# Patient Record
Sex: Female | Born: 1980 | Race: Black or African American | Hispanic: No | Marital: Married
Health system: Southern US, Community
[De-identification: ages and names within clinical notes are randomized; demographics above are authoritative.]

## PROBLEM LIST (undated history)

## (undated) DIAGNOSIS — F419 Anxiety disorder, unspecified: Secondary | ICD-10-CM

## (undated) DIAGNOSIS — F988 Other specified behavioral and emotional disorders with onset usually occurring in childhood and adolescence: Secondary | ICD-10-CM

## (undated) HISTORY — PX: TONSILLECTOMY: SUR1361

---

## 2011-11-03 ENCOUNTER — Emergency Department: Payer: Self-pay

## 2011-11-03 ENCOUNTER — Emergency Department
Admission: EM | Admit: 2011-11-03 | Discharge: 2011-11-03 | Disposition: A | Discharge status: Home / Self Care | Payer: Enrolled Prime—HMO | Attending: Emergency Medicine | Admitting: Emergency Medicine

## 2011-11-03 DIAGNOSIS — S139XXA Sprain of joints and ligaments of unspecified parts of neck, initial encounter: Secondary | ICD-10-CM | POA: Insufficient documentation

## 2011-11-03 DIAGNOSIS — IMO0002 Reserved for concepts with insufficient information to code with codable children: Secondary | ICD-10-CM

## 2011-11-03 HISTORY — DX: Other specified behavioral and emotional disorders with onset usually occurring in childhood and adolescence: F98.8

## 2011-11-03 MED ORDER — CYCLOBENZAPRINE HCL 10 MG PO TABS
10.00 mg | ORAL_TABLET | Freq: Once | ORAL | Status: AC
Start: 2011-11-03 — End: 2011-11-03
  Administered 2011-11-03: 10 mg via ORAL
  Filled 2011-11-03: qty 1

## 2011-11-03 MED ORDER — IBUPROFEN 400 MG PO TABS
800.00 mg | ORAL_TABLET | Freq: Once | ORAL | Status: AC
Start: 2011-11-03 — End: 2011-11-03
  Administered 2011-11-03: 800 mg via ORAL
  Filled 2011-11-03: qty 2

## 2011-11-03 MED ORDER — CYCLOBENZAPRINE HCL 10 MG PO TABS
10.00 mg | ORAL_TABLET | Freq: Once | ORAL | Status: AC
Start: 2011-11-03 — End: 2011-11-13

## 2011-11-03 MED ORDER — IBUPROFEN 800 MG PO TABS
800.00 mg | ORAL_TABLET | Freq: Once | ORAL | Status: AC
Start: 2011-11-03 — End: 2011-11-13

## 2011-11-03 NOTE — Discharge Instructions (Signed)
----------------------------------- Begin Special Instructions ----------------------------------  It has been a pleasure serving you today.     Please take the medicine as needed/prescribed for pain.     Do not drive or drink alcohol if you take an narcotic medicines (Vicodin/Percocet) or muscle relaxers (Flexeril/Valium). These medicines are sedating.     IF this is the first time you have been prescribed both a narcotic and a muscle relaxer, please take them 2 hours apart from each other to ensure you are not unsafely sedated.     Please follow up with your primary care doctor in 4-5 days.     Follow up with the spine specialist as recommended.     Return to the nearest ER IMMEDIATLEY if you develop any numbness or weakness or your arms or legs, if you soil yourself or and any numbness of your groin. Return IMMEDIATELY for sudden or increasing chest pain or abdominal pain, increasing back pain with a fever, or severe headache with nausea and or persistent vomiting or vision changes.     Thank you      Cervical Strain    You have been diagnosed with a neck strain, also called a cervical strain.    The cervical spine is between the base of the skull and the top of the shoulders.    A strain happens when a muscle is stretched, torn or injured. The pain that you feel is caused by inflammation (swelling) or bruising in the muscle. A strain is not the same as a sprain. A sprain is an injury to a ligament that holds bones together.    A cervical strain occurs when the head snaps forward during an accident or a fall. The muscles can easily be strained with this type of movement. It is normal to experience pain over the muscles around the neck but not over the bones of the cervical spine.    Your doctor did not find any pain over the bones in your neck (even though you might have pain in the neck muscles). This means it is very unlikely that you have a fracture in your neck. Your doctor did not think it was necessary  to take an x-ray.    Apply a warm damp washcloth to the neck for 20 minutes at a time, at least 4 times per day. This will reduce your pain. Massaging your neck might also help.    It is normal to feel stiffness and pain in your neck after a strain. This pain may last for the next few days. If your pain stays about the same or gets better, you probably do not need to see a doctor. However, if your symptoms get worse or you have new symptoms, you should return here or go to the nearest Emergency Department.    Call your physician or go to the nearest Emergency Department if you your pain does not improve within 4 weeks or your pain is bad enough to seriously limit your normal activities.    YOU SHOULD SEEK MEDICAL ATTENTION IMMEDIATELY, EITHER HERE OR AT THE NEAREST EMERGENCY DEPARTMENT, IF ANY OF THE FOLLOWING OCCURS:   Your arms and legs tingle or get numb (lose feeling).   Your arms or legs are weak.   You feel that your neck is unstable.   You lose control of your bladder or bowels. If this were to happen, it may cause you to wet or soil yourself. Some people may actually have problems urinating instead.  Your pain gets worse.      ----------------------------------- Begin Special Instructions ----------------------------------  Cervical Strain    You have been diagnosed with a neck strain, also called a cervical strain.    The cervical spine is between the base of the skull and the top of the shoulders.    A strain happens when a muscle is stretched, torn or injured. The pain that you feel is caused by inflammation (swelling) or bruising in the muscle. A strain is not the same as a sprain. A sprain is an injury to a ligament that holds bones together.    A cervical strain occurs when the head snaps forward during an accident or a fall. The muscles can easily be strained with this type of movement. It is normal to experience pain over the muscles around the neck but not over the bones of the  cervical spine.    Your doctor did not find any pain over the bones in your neck (even though you might have pain in the neck muscles). This means it is very unlikely that you have a fracture in your neck. Your doctor did not think it was necessary to take an x-ray.    Apply a warm damp washcloth to the neck for 20 minutes at a time, at least 4 times per day. This will reduce your pain. Massaging your neck might also help.    It is normal to feel stiffness and pain in your neck after a strain. This pain may last for the next few days. If your pain stays about the same or gets better, you probably do not need to see a doctor. However, if your symptoms get worse or you have new symptoms, you should return here or go to the nearest Emergency Department.    Call your physician or go to the nearest Emergency Department if you your pain does not improve within 4 weeks or your pain is bad enough to seriously limit your normal activities.    YOU SHOULD SEEK MEDICAL ATTENTION IMMEDIATELY, EITHER HERE OR AT THE NEAREST EMERGENCY DEPARTMENT, IF ANY OF THE FOLLOWING OCCURS:   Your arms and legs tingle or get numb (lose feeling).   Your arms or legs are weak.   You feel that your neck is unstable.   You lose control of your bladder or bowels. If this were to happen, it may cause you to wet or soil yourself. Some people may actually have problems urinating instead.   Your pain gets worse.        MEDICATION: ORAL NARCOTIC  You have been prescribed a pain medication that is a narcotic. This drug may cause drowsiness. Therefore, be sure to take it only as directed.  DIRECTIONS FOR USE:  If this medicine upsets your stomach, take it with food. Pain medicine should be taken only if needed at the times prescribed. If you are not having pain, do not take the medicine, unless you are advised to do so by your doctor. If you are taking an extended release product, do not chew it or crush it.  WHAT TO WATCH FOR:  POSSIBLE SIDE  EFFECTS: Dizziness, drowsiness (Rise slowly from a seated or lying position). Dry mouth, nausea, vomiting (Eat small meals, chew gum or lie down. If persistent, contact your doctor).Constipation (Drink lots of liquids, eat a diet high in fiber, use small doses of a mild laxative like milk of magnesia as needed). Difficulty passing urine, fainting, trouble breathing, difficulty waking up, chest  pain, fast/irregular heartbeats, seizures (Stop the medicine and contact your doctor or return to this facility promptly).  ALLERGIC REACTION: Rash, itching, swelling, trouble breathing or swallowing (Contact your doctor or return to this facility promptly).  MEDICAL CONDITIONS: Before starting this medicine, be sure your doctor knows if you have any of the following conditions:   Lung disease   Kidney, liver disease   Stomach/intestinal problems   History of head injury or seizures   Alcohol or drug dependency   Prostate enlargement   Pregnancy or breastfeeding  DRUG INTERACTIONS: Before starting this medicine, be sure your doctor knows if you are taking any of the following drugs: narcotic blockers (naltrexone), cimetidine, quinidine, rifampin, alvimopan, SSRI antidepressants (fluoxetine, sertaline, and others), protease inhibitors, serotonin agonists, sibutramine  This drug may cause increased side effects when taken with alcohol, muscle relaxants, sedatives, tricyclic antidepressants, MAO-inhibitors, or another pain medicine.  WARNINGS:   Do not drive, ride a bicycle, or operate dangerous equipment while taking this medicine until you know how it will affect you.   Limit your alcohol use while taking this drug since alcohol use may increase the risk of serious side effects.   Prolonged use of this medicine can be habit forming and may lead to addiction.  [NOTE: This information topic may not include all directions, precautions, medical conditions, drug/food interactions, and warnings for this drug. Check with  your doctor, nurse, or pharmacist for any questions that you may have.]   2000-2011 [employer], [address], [place], [place] [address]. All rights reserved. This information is not intended as a substitute for professional medical care. Always follow your healthcare professional's instructions.
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
## 2019-11-20 ENCOUNTER — Emergency Department: Payer: TRICARE Prime—HMO

## 2019-11-20 ENCOUNTER — Emergency Department
Admission: EM | Admit: 2019-11-20 | Discharge: 2019-11-20 | Disposition: A | Discharge status: Home / Self Care | Payer: TRICARE Prime—HMO | Attending: Emergency Medicine | Admitting: Emergency Medicine

## 2019-11-20 DIAGNOSIS — R030 Elevated blood-pressure reading, without diagnosis of hypertension: Secondary | ICD-10-CM | POA: Insufficient documentation

## 2019-11-20 DIAGNOSIS — S4991XA Unspecified injury of right shoulder and upper arm, initial encounter: Secondary | ICD-10-CM | POA: Insufficient documentation

## 2019-11-20 DIAGNOSIS — Y9321 Activity, ice skating: Secondary | ICD-10-CM | POA: Insufficient documentation

## 2019-11-20 HISTORY — DX: Anxiety disorder, unspecified: F41.9

## 2019-11-20 NOTE — ED Notes (Signed)
Pt d/c to home. Stable, ambulates with steady gait. All belongings with pt.     Pt verbalized understanding. Pt verbalized d/c instructions and f/u instructions.

## 2019-11-20 NOTE — ED Provider Notes (Signed)
EMERGENCY DEPARTMENT HISTORY AND PHYSICAL EXAM     None        Date: 11/20/2019  Patient Name: Paula Rogers    History of Presenting Illness     Chief Complaint   Patient presents with    Shoulder Pain       History Provided By: Patient    Chief Complaint: R poster shoulder pain  Duration: since last Sat night  Timing:  Intermittent, Progressive  Location: R poster shoulder  Quality: tight  Severity: Moderate  Exacerbating factors: when elevating and externally rotating arm   Alleviating factors: not elevating past 90 degrees  Associated Symptoms: R shoulder feels a little weaker  Pertinent Negatives: no head injury, HA, neck or back pain, cp, sob, abd pain, hematuria, hand weakness or numbness    Additional History: Paula Rogers is a 39 y.o. female presenting to the ED with fall when ice skating last sat when her skate front pick caught the ice and she fell fwd with her arms out. Denies LOC, head injury, HA, neck or back pain, cp, sob, abd pain, hematuria, hand weakness or numbness. Continued to skate after as she felt ok but woke up later that night with R poster shoulder pain esp when she elevated arm above 90 degrees and externally rotates. R hand dominant. Denies elbow, forearm, wrist or hand pain or numbness or weakness. Works as Public house manager. Takes motrin with improvement of pain. Drove here and does not want addtl pain meds.      PCP: Paula Rio, DO  SPECIALISTS:    No current facility-administered medications for this encounter.      Current Outpatient Medications   Medication Sig Dispense Refill    amphetamine-dextroamphetamine (ADDERALL) 10 MG tablet Take 10 mg by mouth twice a week      ibuprofen (ADVIL) 600 MG tablet Take 600 mg by mouth every 6 (six) hours as needed for Pain      Methylphenidate HCl (CONCERTA PO) Take by mouth.           Past History     Past Medical History:  Past Medical History:   Diagnosis Date    Anxiety     Attention deficit disorder without mention of hyperactivity         Past Surgical History:  Past Surgical History:   Procedure Laterality Date    TONSILLECTOMY         Family History:  History reviewed. No pertinent family history.    Social History:  Social History     Tobacco Use    Smoking status: Never Smoker    Smokeless tobacco: Never Used   Substance Use Topics    Alcohol use: Yes    Drug use: No       Allergies:  No Known Allergies    Review of Systems     Review of Systems   Respiratory: Negative for shortness of breath.    Cardiovascular: Negative for chest pain.   Gastrointestinal: Negative for abdominal pain, nausea and vomiting.   Genitourinary: Negative for hematuria.   Musculoskeletal: Positive for arthralgias. Negative for back pain, joint swelling and neck pain.   Skin: Negative for color change, rash and wound.   Neurological: Negative for syncope, weakness, numbness and headaches.       Physical Exam   BP (!) 161/99    Pulse 100    Temp 98.3 F (36.8 C) (Oral)    Resp 18    Ht 5'  4" (1.626 m)    Wt 95.3 kg    LMP 11/28/2018    SpO2 100%    BMI 36.05 kg/m   Physical Exam   Constitutional: Patient is oriented to person, place, and time and well-developed, well-nourished, and in no distress.   Head: Normocephalic and atraumatic.   Eyes: EOM are normal. Pupils are equal, round, and reactive to light. pink subconj. No scleral icterus  ENT: OP clear, MMM  Neck: Normal range of motion. Neck supple. No Cspine ttp  Cardiovascular: Normal rate and regular rhythm. No murmurs or rubs.  Pulmonary/Chest: Effort normal and breath sounds normal. No respiratory distress.   Abdominal: Soft. There is no tenderness. No rebound or guarding.  Musculoskeletal: Normal range of motion R shoulder and able to actively elevated past 90 degrees although has pain when she does so. Can reach across chest to other shoulder using R hand. No ttp or deformities or swelling RUE. Intact R hand grip strength, sensation to light touch, R radial pulse  Neurological: Patient is alert and  oriented to person, place, and time. GCS score is 15.   Skin: Skin is warm and dry.         Diagnostic Study Results     Labs -     Results     ** No results found for the last 24 hours. **          Radiologic Studies -   Radiology Results (24 Hour)     Procedure Component Value Units Date/Time    Shoulder Right 2+ Views [[id number]] Collected: 11/20/19 0916    Order Status: Completed Updated: 11/20/19 0918    Narrative:      HISTORY: Right shoulder pain    TECHNIQUE: Three views     FINDINGS: There is no radiographic evidence for acute bony, soft tissue  or joint space abnormality.      Impression:       Negative    Paula Peck, MD   11/20/2019 9:16 AM      .    Medical Decision Making   I am the first provider for this patient.    I reviewed the vital signs, available nursing notes, past medical history, past surgical history, family history and social history.    Vital Signs-Reviewed the patient's vital signs.     Patient Vitals for the past 12 hrs:   BP Temp Pulse Resp   11/20/19 0834 -- -- -- 18   11/20/19 0824 (!) 161/99 98.3 F (36.8 C) 100 --       Pulse Oximetry Analysis - Normal 100% on RA    Old Medical Records: Nursing notes.     ED Course:       The patient verbalizes understanding of the test results, short and long term treatment plan, indications to return to emergency department, need for immediate follow up, and possible medication side effects. The patient agrees with discharge home at this time.    Provider Notes (Initial Assessment): R shoulder injury last week when ice skating with poster shoulder pain with external rotation and abduction of R arm > 90 degrees. Nvi. suspected rotator cuff tear. Will check xr, refer to PT and outpt orthoProv             Diagnosis     Clinical Impression:   1. Injury of right shoulder, initial encounter    2. Elevated blood pressure reading        Treatment Plan:   ED  Disposition     ED Disposition Condition Date/Time Comment    Discharge  Fri Nov 20, 2019  9:21 AM  Valda Favia discharge to home/self care.    Condition at disposition: Stable            _______________________________  Attestations:     None        I am the first provider for this patient.    Marquette Old, MD is the primary emergency doctor of record.    I reviewed the vital signs, available nursing notes, past medical history, past surgical history, family history and social history.       Marquette Old, MD  11/20/19 [phone]

## 2019-11-20 NOTE — Discharge Instructions (Signed)
Elevated Blood Pressure    During your visit today your blood pressure was higher than normal.    Check your blood pressure several times over the next several days, then follow up with your regular doctor. If you do not have a doctor, ask the medical staff to refer you to one.    You may need medication for your blood pressure if it stays high. Untreated high blood pressure can cause damage to your heart and kidneys and may lead to a heart attack or stroke. It is VERY IMPORTANT to follow up with your doctor.   Check your blood pressure daily and follow up with your doctor.   A doctor will diagnose high blood pressure only if your blood pressure is high for several days. Many pharmacies have machines that let you check your own blood pressure. You can also check with a fire station to see whether a paramedic will take your blood pressure. Another option is to purchase a blood pressure monitor to use at home. These are available at most pharmacies.     YOU SHOULD SEEK MEDICAL ATTENTION IMMEDIATELY, EITHER HERE OR AT THE NEAREST EMERGENCY DEPARTMENT, IF ANY OF THE FOLLOWING OCCURS:   You have a sudden or severe headache.   You are numb, tingly, or weak on one side of your body, half of your face droops, or you have trouble speaking.   You have chest pain.   You are short of breath.

## 2021-06-06 ENCOUNTER — Emergency Department (HOSPITAL_COMMUNITY)
Admission: EM | Admit: 2021-06-06 | Discharge: 2021-06-06 | Disposition: A | Discharge status: Home / Self Care | Attending: Emergency Medicine | Admitting: Emergency Medicine

## 2021-06-06 ENCOUNTER — Emergency Department (HOSPITAL_COMMUNITY)

## 2021-06-06 ENCOUNTER — Other Ambulatory Visit: Payer: Self-pay

## 2021-06-06 DIAGNOSIS — R531 Weakness: Secondary | ICD-10-CM | POA: Insufficient documentation

## 2021-06-06 DIAGNOSIS — R202 Paresthesia of skin: Secondary | ICD-10-CM | POA: Diagnosis present

## 2021-06-06 DIAGNOSIS — R29898 Other symptoms and signs involving the musculoskeletal system: Secondary | ICD-10-CM

## 2021-06-06 LAB — I-STAT BETA HCG BLOOD, ED (MC, WL, AP ONLY): I-stat hCG, quantitative: <5 m[IU]/mL (ref <5)

## 2021-06-06 LAB — URINALYSIS, ROUTINE W REFLEX MICROSCOPIC
Bacteria, UA: NONE SEEN
Bilirubin Urine: NEGATIVE
Glucose, UA: NEGATIVE mg/dL
Ketones, ur: NEGATIVE mg/dL
Leukocytes,Ua: NEGATIVE
Nitrite: NEGATIVE
Protein, ur: NEGATIVE mg/dL
RBC / HPF: >50 RBC/hpf — ABNORMAL HIGH (ref 0–5)
Specific Gravity, Urine: 1.006 (ref 1.005–1.030)
pH: 6 (ref 5.0–8.0)

## 2021-06-06 LAB — BASIC METABOLIC PANEL
Anion gap: 7 (ref 5–15)
BUN: 5 mg/dL — ABNORMAL LOW (ref 6–20)
CO2: 26 mmol/L (ref 22–32)
Calcium: 9 mg/dL (ref 8.9–10.3)
Chloride: 103 mmol/L (ref 98–111)
Creatinine, Ser: 0.85 mg/dL (ref 0.44–1.00)
GFR, Estimated: >60 mL/min (ref >60)
Glucose, Bld: 105 mg/dL — ABNORMAL HIGH (ref 70–99)
Potassium: 3.1 mmol/L — ABNORMAL LOW (ref 3.5–5.1)
Sodium: 136 mmol/L (ref 135–145)

## 2021-06-06 LAB — CBC
HCT: 36.3 % (ref 36.0–46.0)
Hemoglobin: 11.8 g/dL — ABNORMAL LOW (ref 12.0–15.0)
MCH: 27.2 pg (ref 26.0–34.0)
MCHC: 32.5 g/dL (ref 30.0–36.0)
MCV: 83.6 fL (ref 80.0–100.0)
Platelets: 408 10*3/uL — ABNORMAL HIGH (ref 150–400)
RBC: 4.34 MIL/uL (ref 3.87–5.11)
RDW: 14.7 % (ref 11.5–15.5)
WBC: 5.9 10*3/uL (ref 4.0–10.5)
nRBC: 0 % (ref 0.0–0.2)

## 2021-06-06 LAB — CBG MONITORING, ED: Glucose-Capillary: 101 mg/dL — ABNORMAL HIGH (ref 70–99)

## 2021-06-06 IMAGING — MR MR HEAD WO/W CM
7 of 14 series · 24 of 48 positions shown · IV contrast (gadavist)
Comparison: None.

CLINICAL DATA: Neuro deficit, acute, stroke suspected. Left arm
weakness.

EXAM:
MRI HEAD WITHOUT AND WITH CONTRAST
TECHNIQUE: Multiplanar, multiecho pulse sequences of the brain and surrounding
structures were obtained without and with intravenous contrast.
CONTRAST:  10mL GADAVIST GADOBUTROL 1 MMOL/ML IV SOLN

[Series 2: DWI · axial · 3.0mm · 0.94mm/px · z∈[-75,+72]mm · 8 of 104 slices shown (1 of 2)]
[im 1/104]
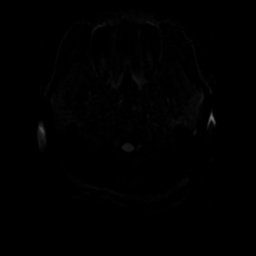
[im 15/104]
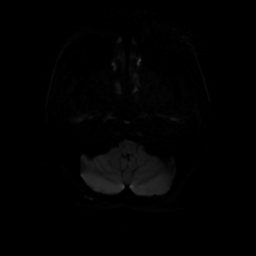
[im 30/104]
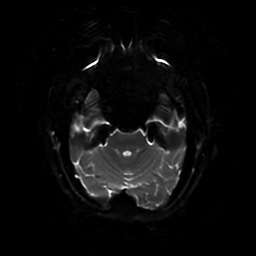
[im 45/104]
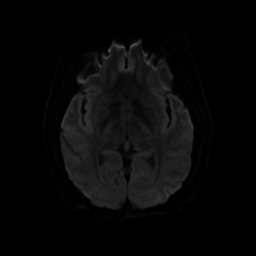
[im 59/104]
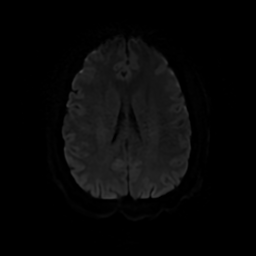
[im 74/104]
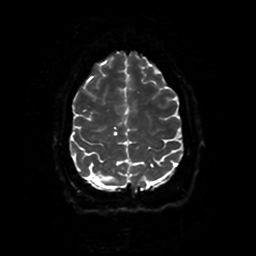
[im 89/104]
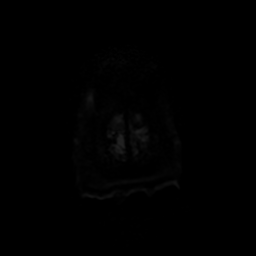
[im 104/104]
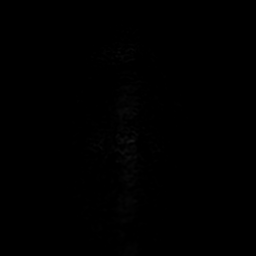

[Series 3: DWI · coronal · 4.0mm · 0.94mm/px · 5 of 74 slices shown (2 of 2)]
[im 1/74]
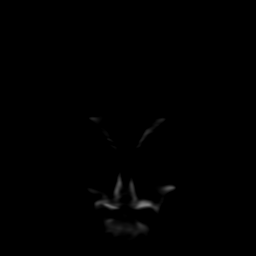
[im 19/74]
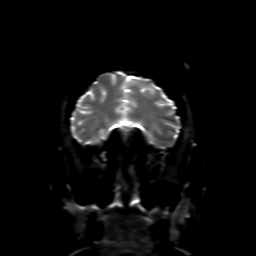
[im 37/74]
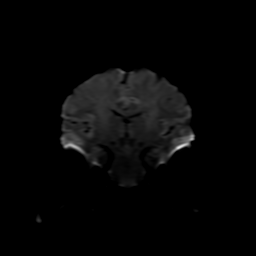
[im 55/74]
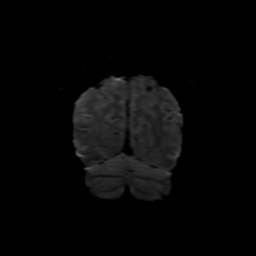
[im 74/74]
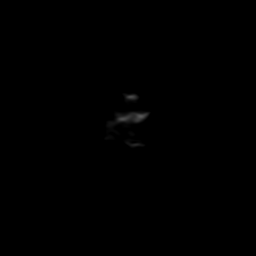

[Series 4: FLAIR · sagittal · 5.0mm · 0.23mm/px · 2 of 28 slices shown (1 of 2)]
[im 1/28]
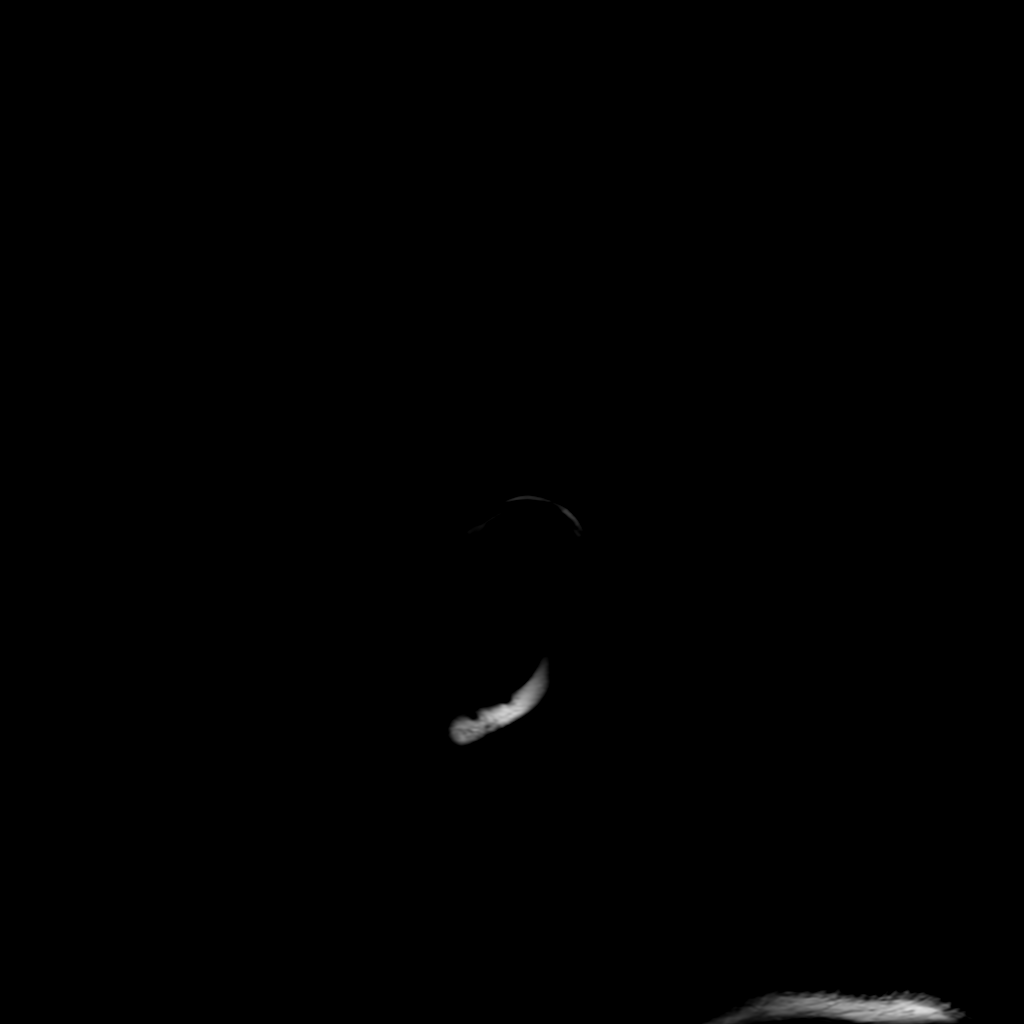
[im 28/28]
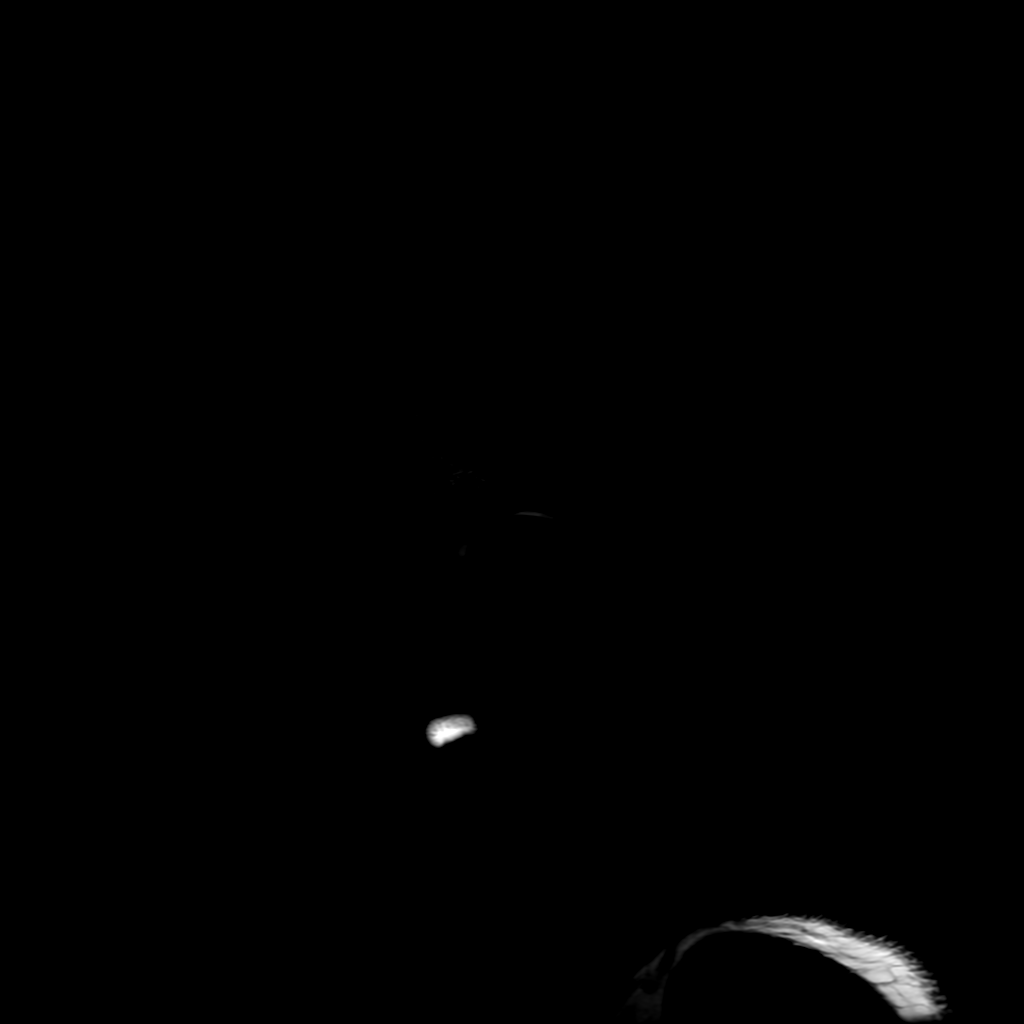

[Series 8: FLAIR · axial · 4.0mm · 0.45mm/px · z∈[-62,+84]mm · 2 of 35 slices shown (2 of 2)]
[im 1/35]
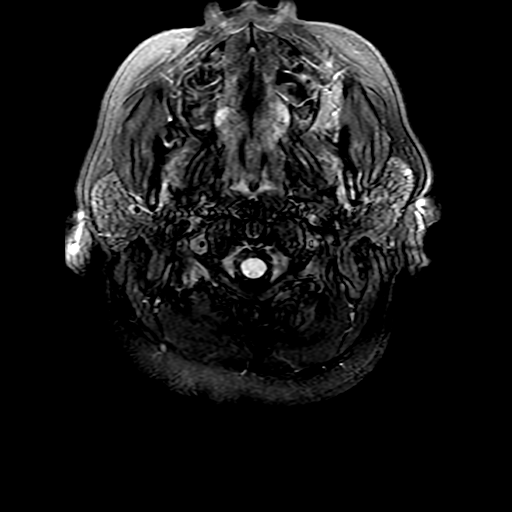
[im 35/35]
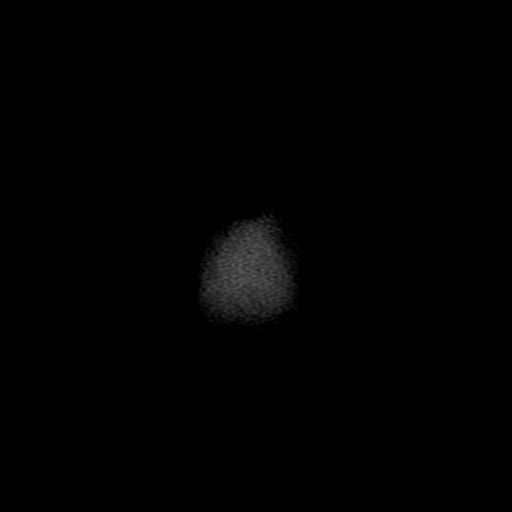

[Series 21: FLAIR post-contrast · sagittal · 5.0mm · 0.47mm/px · 2 of 28 slices shown]
[im 1/28]
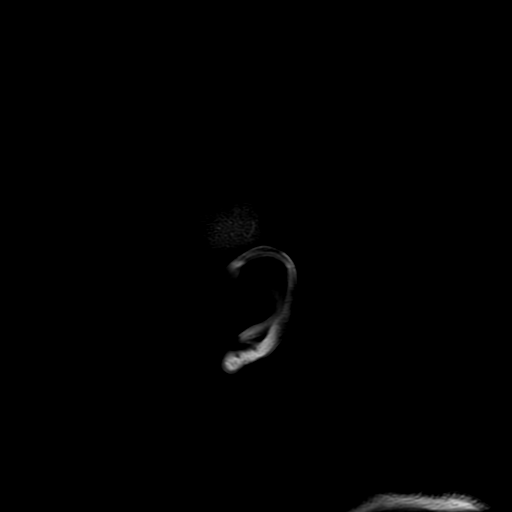
[im 28/28]
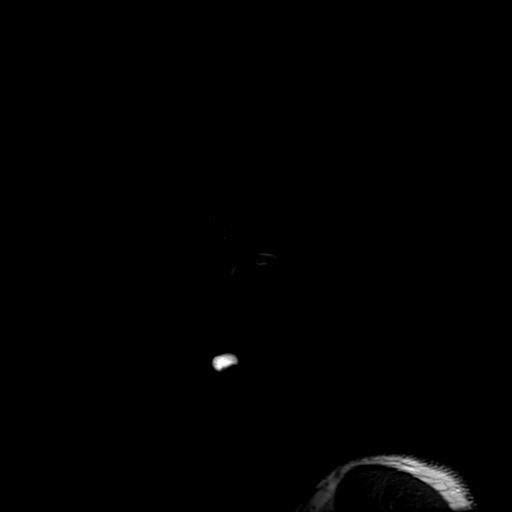

[Series 250: ADC · axial · 3.0mm · 0.94mm/px · z∈[-75,+72]mm · 3 of 52 slices shown (1 of 2)]
[im 1/52]
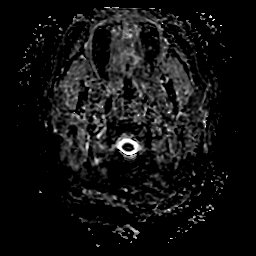
[im 26/52]
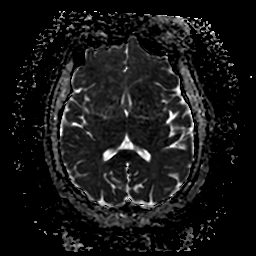
[im 52/52]
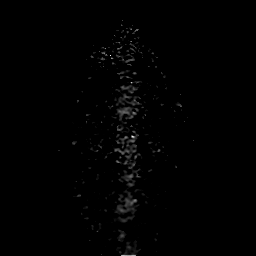

[Series 350: ADC · coronal · 4.0mm · 0.94mm/px · 2 of 38 slices shown (2 of 2)]
[im 1/38]
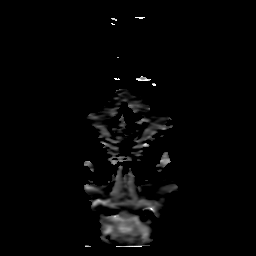
[im 38/38]
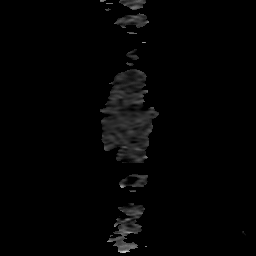

[24 of 48 positions shown; findings below may reference images not displayed]

FINDINGS: Brain: No acute infarct, mass effect or extra-axial collection. No
acute or chronic hemorrhage. Bilateral, frontal predominant,
multifocal hyperintense T2-weighted signal in the white matter, as
may be seen in patients with migraines, but is also commonly present
in asymptomatic patients. The midline structures are normal.

Vascular: Major flow voids are preserved.

Skull and upper cervical spine: Normal calvarium and skull base.
Visualized upper cervical spine and soft tissues are normal.

Sinuses/Orbits:No paranasal sinus fluid levels or advanced mucosal
thickening. No mastoid or middle ear effusion. Normal orbits.
IMPRESSION: Normal brain MRI.

## 2021-06-06 IMAGING — MR MR CERVICAL SPINE WO/W CM
4 of 8 series · 19 of 48 positions shown · IV contrast (10 ML GAD)
Comparison: None.

CLINICAL DATA: Cervical radiculopathy, infection suspected

EXAM:
MRI CERVICAL SPINE WITHOUT AND WITH CONTRAST
TECHNIQUE: Multiplanar and multiecho pulse sequences of the cervical spine, to
include the craniocervical junction and cervicothoracic junction,
were obtained without and with intravenous contrast.
CONTRAST:  10mL GADAVIST GADOBUTROL 1 MMOL/ML IV SOLN

[Series 6: T2 · sagittal · 3.0mm · 0.35mm/px · 4 of 18 slices shown (1 of 2)]
[im 1/18]
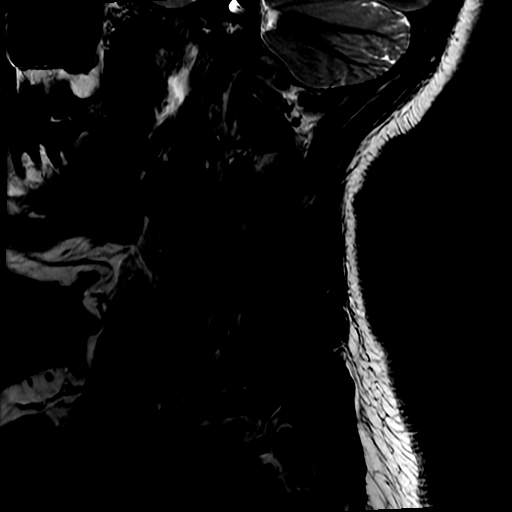
[im 6/18]
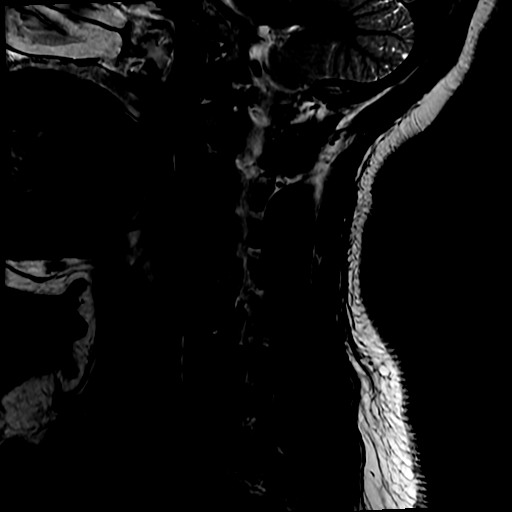
[im 12/18]
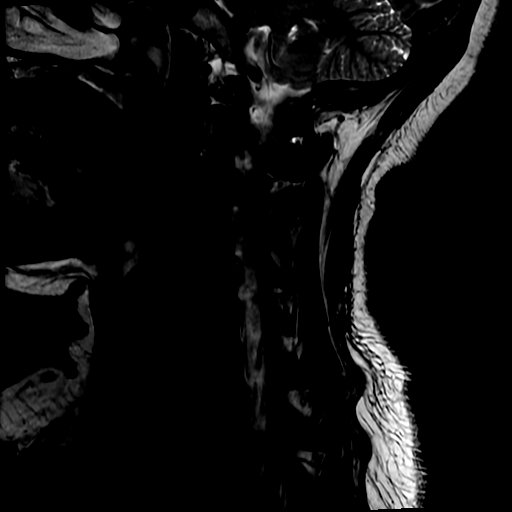
[im 18/18]
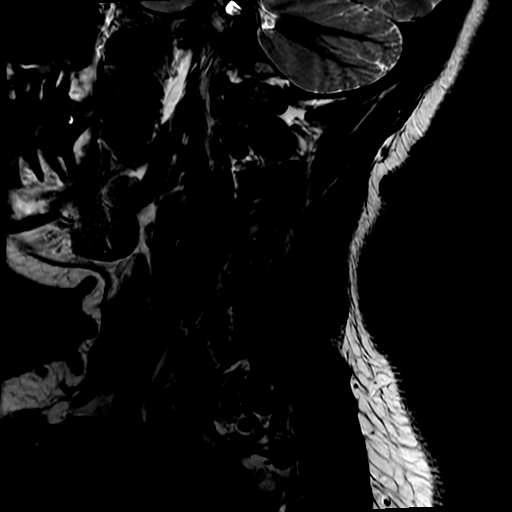

[Series 14: T2 · axial · 3.0mm · 0.35mm/px · z∈[-179,-58]mm · 6 of 39 slices shown (2 of 2)]
[im 1/39]
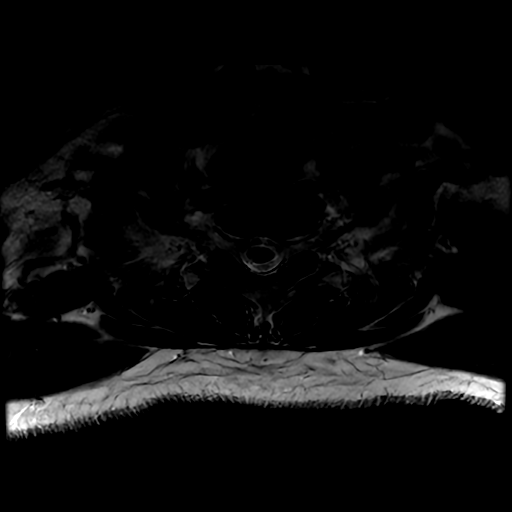
[im 8/39]
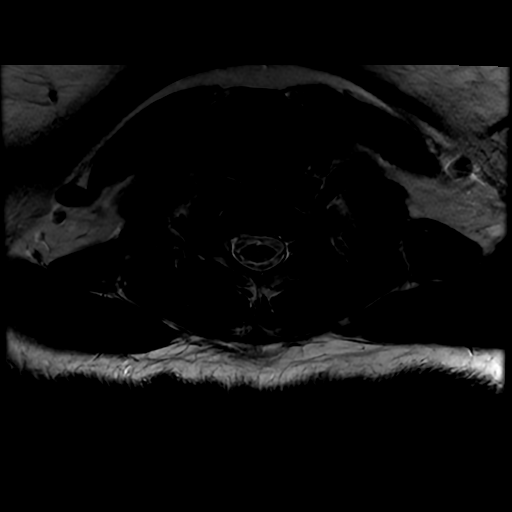
[im 16/39]
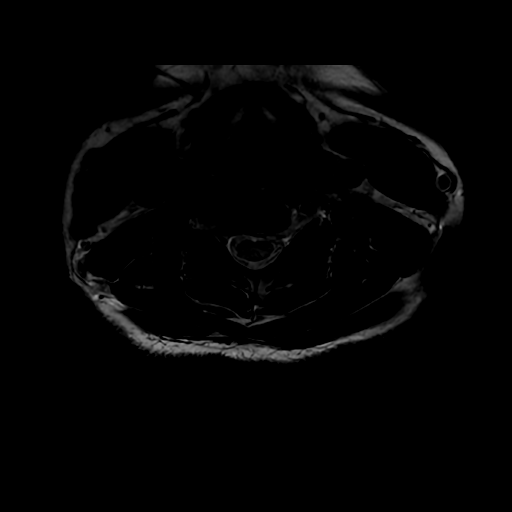
[im 23/39]
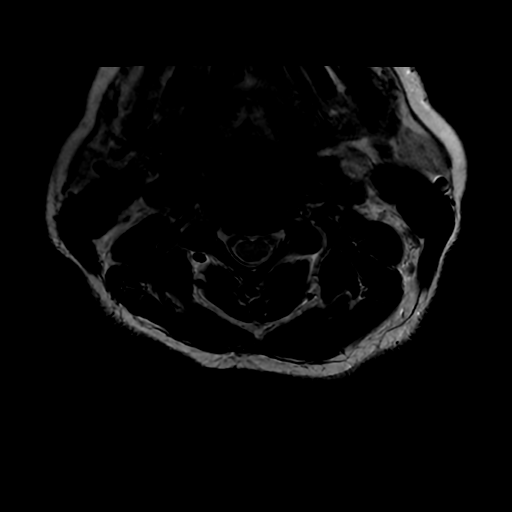
[im 31/39]
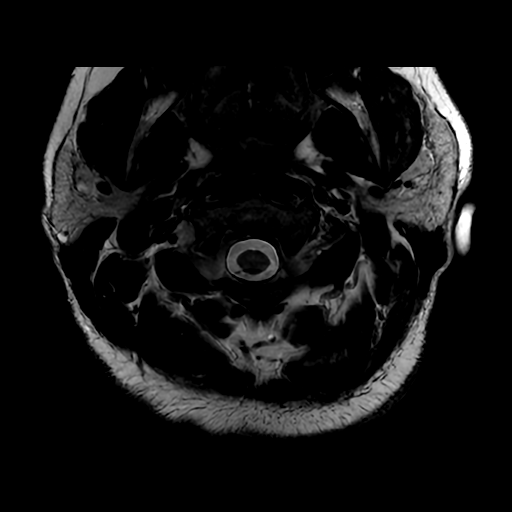
[im 39/39]
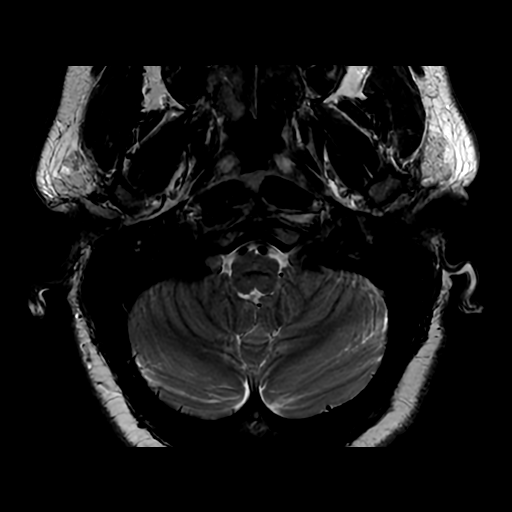

[Series 15: T1 · axial · non-contrast · 3.0mm · 0.35mm/px · z∈[-179,-58]mm · 6 of 39 slices shown]
[im 1/39]
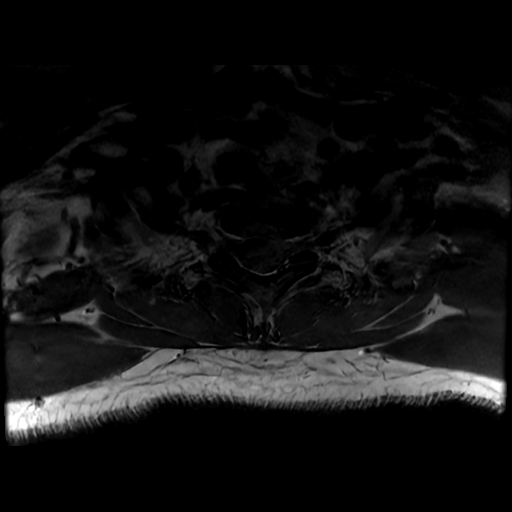
[im 8/39]
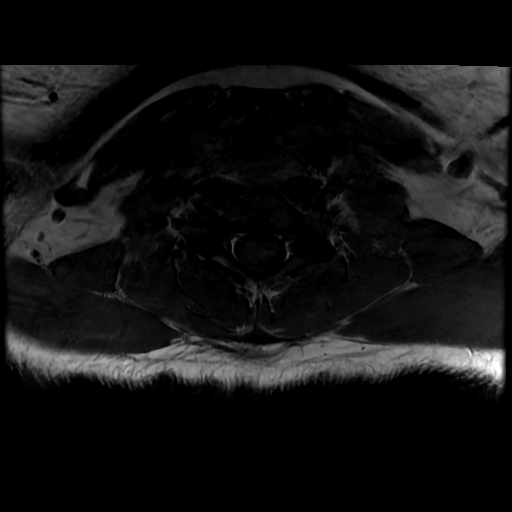
[im 16/39]
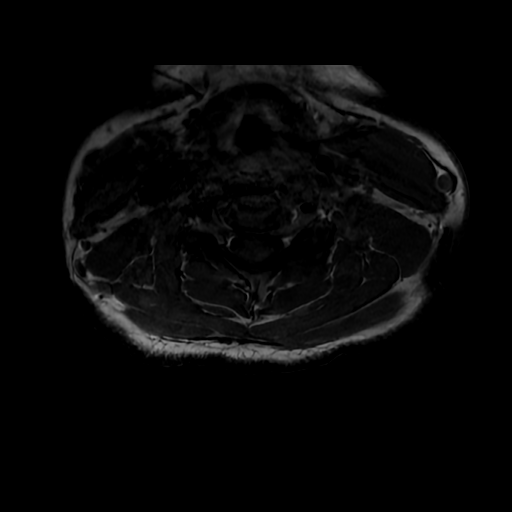
[im 23/39]
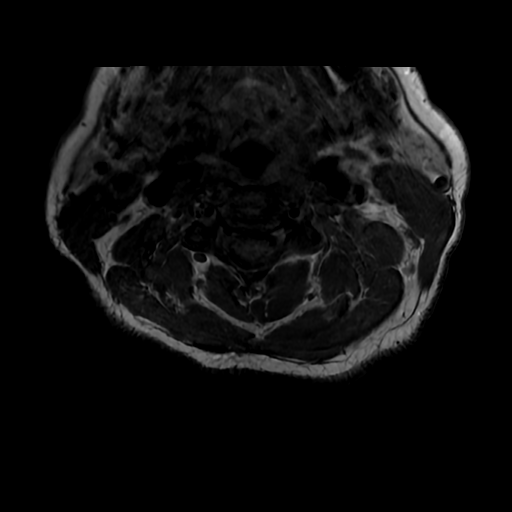
[im 31/39]
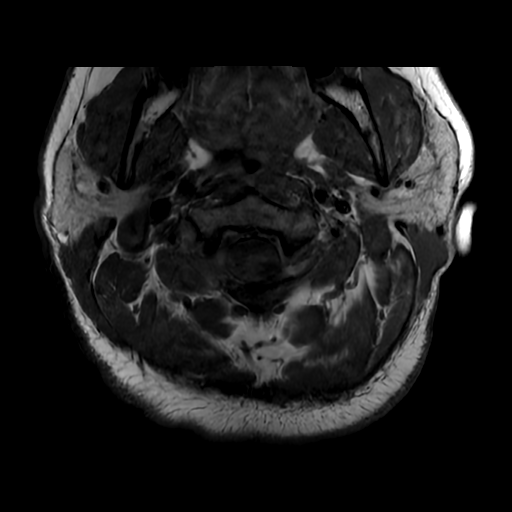
[im 39/39]
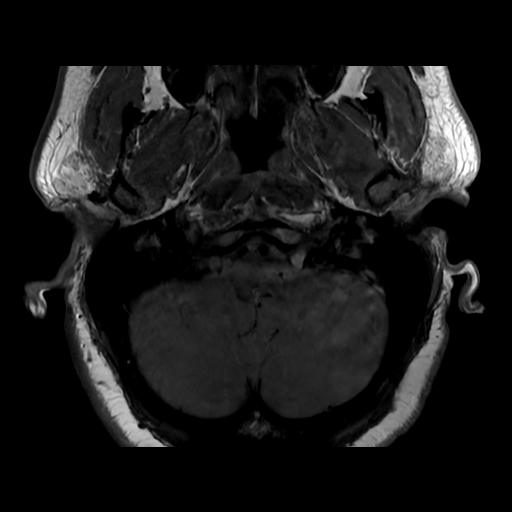

[Series 16: T1 fat-sat post-contrast · sagittal · 3.0mm · 0.35mm/px · 3 of 18 slices shown]
[im 1/18]
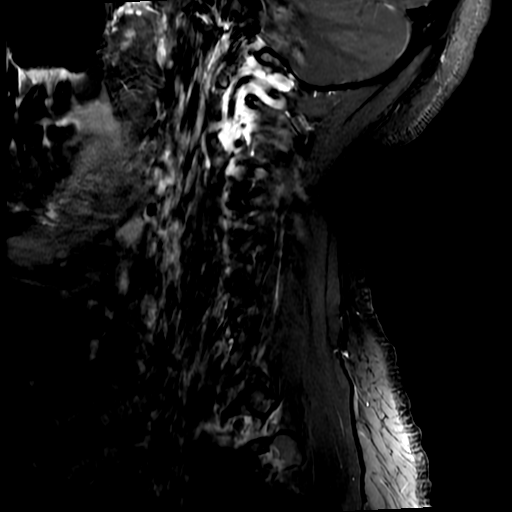
[im 9/18]
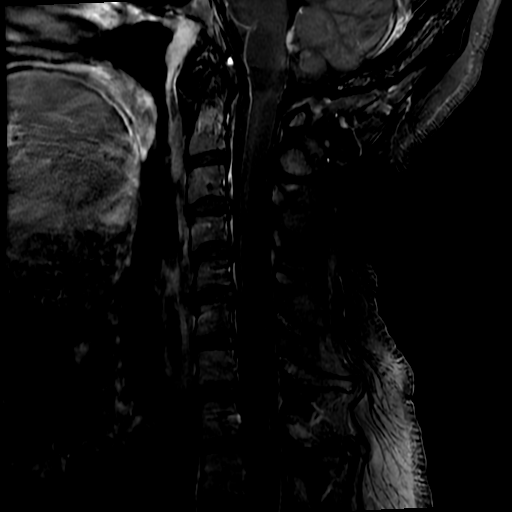
[im 18/18]
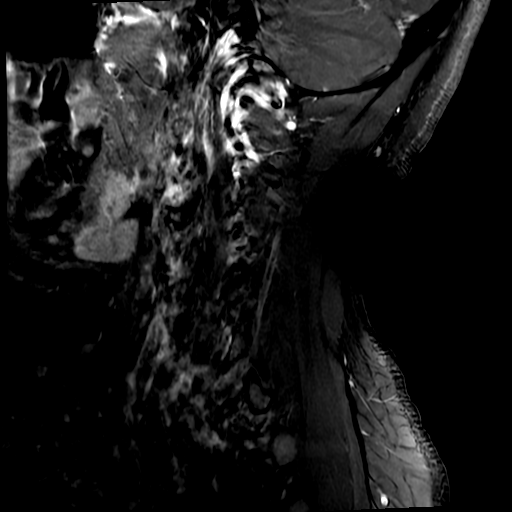

[19 of 48 positions shown; findings below may reference images not displayed]

FINDINGS: Alignment: Physiologic.

Vertebrae: No fracture, evidence of discitis, or bone lesion.

Cord: Normal signal and morphology.

Posterior Fossa, vertebral arteries, paraspinal tissues: Negative.

Disc levels:

C1-2: Unremarkable.

C2-3: Normal disc space and facet joints. There is no spinal canal
stenosis. No neural foraminal stenosis.

C3-4: Normal disc space and facet joints. There is no spinal canal
stenosis. No neural foraminal stenosis.

C4-5: Normal disc space and facet joints. There is no spinal canal
stenosis. No neural foraminal stenosis.

C5-6: Small central disc protrusion. There is no spinal canal
stenosis. No neural foraminal stenosis.

C6-7: Normal disc space and facet joints. There is no spinal canal
stenosis. No neural foraminal stenosis.

C7-T1: Normal disc space and facet joints. There is no spinal canal
stenosis. No neural foraminal stenosis.

No abnormal contrast enhancement.
IMPRESSION: 1. No acute abnormality of the cervical spine.
2. Mild cervical degenerative disc disease without spinal canal or
neural foraminal stenosis.

## 2021-06-06 MED ORDER — GADOBUTROL 1 MMOL/ML IV SOLN
10.0000 mL | Freq: Once | INTRAVENOUS | Status: AC | PRN
  Administered 2021-06-06: 10 mL via INTRAVENOUS

## 2021-06-06 MED ORDER — GADOBUTROL 1 MMOL/ML IV SOLN: 10.0000 mL | Freq: Once | INTRAVENOUS | Status: DC | PRN

## 2021-06-06 MED ORDER — LORAZEPAM 2 MG/ML IJ SOLN
1.0000 mg | Freq: Once | INTRAMUSCULAR | Status: AC | PRN
  Administered 2021-06-06: 1 mg via INTRAVENOUS
  Filled 2021-06-06: qty 1

## 2021-06-06 MED ORDER — LACTATED RINGERS IV SOLN: INTRAVENOUS | Status: DC

## 2021-06-06 NOTE — ED Notes (Signed)
Pt ambulated to restroom w/ steady gait to change into clothes w/o incident.

## 2021-06-06 NOTE — ED Notes (Signed)
Patient transported to MRI 

## 2021-06-06 NOTE — ED Triage Notes (Signed)
Pt here POV with c/o of left arm weakness. Began 06/05/2021 at 0600. Pt moving, increased stressed, and anxiety endorsed. Left sided leg droop at baseline.  Pt stroke scale 1 for decreased sensation. A/O X4

## 2021-06-06 NOTE — ED Notes (Signed)
Pt returned from MRI °

## 2021-06-06 NOTE — ED Provider Notes (Addendum)
Emergency Medicine Provider Triage Evaluation Note  Cathy Copeland , a 40 y.o. female  was evaluated in triage.  Pt complains of left arm paresthesias and subjective weakness which started yesterday morning.  Patient states that she is moving and slept on the floor.  She slept on her left arm in an awkward manner.  She states that she has some tingling in her arm and also weakness.  She describes being weak with trying to lift a grocery bag and needed to use her upper arm to help with this.  No neck pain or headache.  She does have a history of left leg sensation changes and weakness stemming from a epidural she had 13 years ago.  This is unchanged.  Review of Systems  Positive: Paresthesias, weakness Negative: Headache, speech difficulty, vision change  Physical Exam  BP (!) 180/98   Pulse 99   Temp 98.4 F (36.9 C) (Oral)   Resp 18   Ht 5\' 4"  (1.626 m)   Wt 99.8 kg   SpO2 100%   BMI 37.76 kg/m  Gen:   Awake, no distress   Resp:  Normal effort  MSK:   Moves extremities without difficulty    Upper extremity myotomes tested bilaterally:  C5 Shoulder abduction 5/5 C6 Elbow flexion/wrist extension 5/5 C7 Elbow extension 5/5 C8 Finger flexion 5/5 T1 Finger abduction 5/5  Other:  Heart reg rhythm  Medical Decision Making  Medically screening exam initiated at 8:44 AM.  Appropriate orders placed.  Frenchie Pribyl was informed that the remainder of the evaluation will be completed by another provider, this initial triage assessment does not replace that evaluation, and the importance of remaining in the ED until their evaluation is complete.  Asked to evaluate to see if patient qualified for code stroke.  I do not feel that she meets criteria.  She is out of the window.  Also symptoms are more consistent with a peripheral nerve palsy, although at time of exam she does not have any demonstratable weakness.  ED ECG REPORT   Date: 06/06/2021  Rate: 102  Rhythm: sinus tachycardia  QRS  Axis: normal  Intervals: normal  ST/T Wave abnormalities: normal  Conduction Disutrbances:none  Narrative Interpretation:   Old EKG Reviewed: none available  I have personally reviewed the EKG tracing and agree with the computerized printout as noted.       06/08/2021, PA-C 06/06/21 06/08/21    [id number], MD 06/06/21 [phone]

## 2021-06-06 NOTE — ED Provider Notes (Signed)
[facility] EMERGENCY DEPARTMENT Provider Note   CSN: [id number] Arrival date & time: 06/06/21  4235     History No chief complaint on file.   Cathy Copeland is a 40 y.o. female.  HPI Patient is recently been moving.  She reports she did sleep on the floor 2 nights ago and awakened with left arm numbness and tingling.  She reports she also experienced some weakness of the lower aspect of the arm moving the wrist and the hand but was able to do so.  She reports today she feels like she has more paresthesias and continues to have some weakness in the hand.  No headache, no neck pain.  No nausea no vomiting.  No visual changes.  Patient has had longstanding lateral lower leg sensation change attributed to distant epidural.  No acute changes.    No past medical history on file.  There are no problems to display for this patient.      OB History   No obstetric history on file.     No family history on file.     Home Medications Prior to Admission medications   Medication Sig Start Date End Date Taking? Authorizing Provider  amphetamine-dextroamphetamine (ADDERALL) 10 MG tablet Take 10 mg by mouth daily as needed. When needing to focus   Yes [provider]  fluticasone (FLONASE) 50 MCG/ACT nasal spray Place 1-2 sprays into both nostrils 2 (two) times daily as needed for allergies or rhinitis.   Yes [provider]    Allergies    Patient has no known allergies.  Review of Systems   Review of Systems 10 systems reviewed and negative except as per HPI Physical Exam Updated Vital Signs BP (!) 186/94 (BP Location: Right Arm)   Pulse 94   Temp 98.3 F (36.8 C)   Resp 20   Ht 5\' 4"  (1.626 m)   Wt 99.8 kg   SpO2 100%   BMI 37.76 kg/m   Physical Exam Constitutional:      Appearance: Normal appearance.  HENT:     Head: Normocephalic and atraumatic.     Nose: Nose normal.     Mouth/Throat:     Mouth: Mucous membranes are moist.      Pharynx: Oropharynx is clear.  Eyes:     Extraocular Movements: Extraocular movements intact.     Pupils: Pupils are equal, round, and reactive to light.  Cardiovascular:     Rate and Rhythm: Normal rate and regular rhythm.  Pulmonary:     Effort: Pulmonary effort is normal.     Breath sounds: Normal breath sounds.  Abdominal:     General: There is no distension.     Palpations: Abdomen is soft.     Tenderness: There is no abdominal tenderness. There is no guarding.  Musculoskeletal:     Cervical back: Neck supple.     Comments: Extremity swelling.  Pulses 2+ symmetric.  Hands warm and dry.  She endorses subjective difference to light touch on the left and right upper extremities.  Biceps tendon reflex intact.  Lower extremities symmetric motor strength 5\5.  Patient with some sensory differential left to right but reports this is been baseline.  Patellar flexes 2+ symmetric  Skin:    General: Skin is warm and dry.  Neurological:     General: No focal deficit present.     Mental Status: She is alert and oriented to person, place, and time.     Motor: No  weakness.     Coordination: Coordination normal.  Psychiatric:        Mood and Affect: Mood normal.    ED Results / Procedures / Treatments   Labs (all labs ordered are listed, but only abnormal results are displayed) Labs Reviewed  BASIC METABOLIC PANEL - Abnormal; Notable for the following components:      Result Value   Potassium 3.1 (*)    Glucose, Bld 105 (*)    BUN 5 (*)    All other components within normal limits  CBC - Abnormal; Notable for the following components:   Hemoglobin 11.8 (*)    Platelets 408 (*)    All other components within normal limits  URINALYSIS, ROUTINE W REFLEX MICROSCOPIC - Abnormal; Notable for the following components:   Hgb urine dipstick LARGE (*)    RBC / HPF >50 (*)    All other components within normal limits  CBG MONITORING, ED - Abnormal; Notable for the following components:    Glucose-Capillary 101 (*)    All other components within normal limits  I-STAT BETA HCG BLOOD, ED ([facility], [facility], [facility] ONLY)    EKG EKG Interpretation  Date/Time:  Tuesday June 06 2021 07:54:28 EDT Ventricular Rate:  102 PR Interval:  116 QRS Duration: 76 QT Interval:  364 QTC Calculation: 474 R Axis:   43 Text Interpretation: Sinus tachycardia Otherwise normal ECG agree, no old comparison Confirmed by Arby Barrette [phone]) on 06/06/2021 2:01:24 PM  Radiology No results found.  Procedures Procedures   Medications Ordered in ED Medications  lactated ringers infusion (has no administration in time range)  LORazepam (ATIVAN) injection 1 mg (has no administration in time range)    ED Course  I have reviewed the triage vital signs and the nursing notes.  Pertinent labs & imaging results that were available during my care of the patient were reviewed by me and considered in my medical decision making (see chart for details).    MDM Rules/Calculators/A&P                          Patient presents as outlined.  Symptoms weakness numbness left upper extremity may be radicular or compression palsy.  Patient does not have neck pain fevers or headache.  At this time if MRI does not show any findings concerning for MS or compression etiology, anticipate discharge with follow-up for compression neuropathy Final Clinical Impression(s) / ED Diagnoses Final diagnoses:  Weakness of left upper extremity  Paresthesia of left upper extremity    Rx / DC Orders ED Discharge Orders     None        Arby Barrette, MD 06/06/21 1629

## 2021-06-06 NOTE — ED Provider Notes (Signed)
Pt care assumed at 1600.  Pt with two days of LUE numbness/weakness.  MRI brain/cspine pending.   MRI is negative for acute abnormality. Discussed with patient findings studies recommendation for outpatient neurology follow-up.   Tilden Fossa, MD 06/06/21 2242
